# Patient Record
Sex: Male | Born: 1970 | Race: Black or African American | Hispanic: No | Marital: Married | Smoking: Never smoker
Health system: Southern US, Community
[De-identification: ages and names within clinical notes are randomized; demographics above are authoritative.]

---

## 2002-11-08 ENCOUNTER — Emergency Department (HOSPITAL_COMMUNITY): Admission: EM | Admit: 2002-11-08 | Discharge: 2002-11-08 | Payer: Self-pay | Admitting: Emergency Medicine

## 2006-04-21 ENCOUNTER — Ambulatory Visit (HOSPITAL_COMMUNITY): Admission: RE | Admit: 2006-04-21 | Discharge: 2006-04-21 | Payer: Self-pay | Admitting: Family Medicine

## 2011-03-21 ENCOUNTER — Inpatient Hospital Stay (INDEPENDENT_AMBULATORY_CARE_PROVIDER_SITE_OTHER)
Admission: RE | Admit: 2011-03-21 | Discharge: 2011-03-21 | Disposition: A | Discharge status: Home / Self Care | Payer: Commercial Managed Care - PPO | Source: Ambulatory Visit | Attending: Family Medicine | Admitting: Family Medicine

## 2011-03-21 DIAGNOSIS — K089 Disorder of teeth and supporting structures, unspecified: Secondary | ICD-10-CM

## 2015-05-31 ENCOUNTER — Emergency Department (HOSPITAL_BASED_OUTPATIENT_CLINIC_OR_DEPARTMENT_OTHER)
Admission: EM | Admit: 2015-05-31 | Discharge: 2015-05-31 | Disposition: A | Discharge status: Home / Self Care | Payer: 59 | Attending: Emergency Medicine | Admitting: Emergency Medicine

## 2015-05-31 ENCOUNTER — Encounter (HOSPITAL_BASED_OUTPATIENT_CLINIC_OR_DEPARTMENT_OTHER): Payer: Self-pay

## 2015-05-31 DIAGNOSIS — L989 Disorder of the skin and subcutaneous tissue, unspecified: Secondary | ICD-10-CM | POA: Diagnosis not present

## 2015-05-31 DIAGNOSIS — M79642 Pain in left hand: Secondary | ICD-10-CM | POA: Diagnosis present

## 2015-05-31 NOTE — Discharge Instructions (Signed)
You will need to follow up with a dermatologist. [facility] are usually harmless growths on the skin. They are accumulations of color (pigment) cells in the skin that:   Can be various colors, from light brown to black.  Can appear anywhere on the body.  May remain flat or become raised.  May contain hairs.  May remain smooth or develop wrinkling. Most moles are not cancerous (benign). However, some moles may develop changes and become cancerous. It is important to check your moles every month. If you check your moles regularly, you will be able to notice any changes that may occur.  CAUSES  Moles occur when skin cells grow together in clusters instead of spreading out in the skin as they normally do. The reason for this clustering is unknown. DIAGNOSIS  Your caregiver will perform a skin examination to diagnose your mole.  TREATMENT  Moles usually do not require treatment. If a mole becomes worrisome, your caregiver may choose to take a sample of the mole or remove it entirely, and then send it to a lab for examination.  HOME CARE INSTRUCTIONS  Check your mole(s) monthly for changes that may indicate skin cancer. These changes can include:  A change in size.  A change in color. Note that moles tend to darken during pregnancy or when taking birth control pills (oral contraception).  A change in shape.  A change in the border of the mole.  Wear sunscreen (with an SPF of at least 30) when you spend long periods of time outside. Reapply the sunscreen every 2-3 hours.  Schedule annual appointments with your skin doctor (dermatologist) if you have a large number of moles. SEEK MEDICAL CARE IF:  Your mole changes size, especially if it becomes larger than a pencil eraser.  Your mole changes in color or develops more than one color.  Your mole becomes itchy or bleeds.  Your mole, or the skin near the mole, becomes painful, sore, red, or swollen.  Your mole becomes scaly, sheds  skin, or oozes fluid.  Your mole develops irregular borders.  Your mole becomes flat or develops raised areas.  Your mole becomes hard or soft. Document Released: 06/09/2001 Document Revised: 06/08/2012 Document Reviewed: 03/28/2012 [facility] Patient Information 2015 [place], [place]. This information is not intended to replace advice given to you by your health care provider. Make sure you discuss any questions you have with your health care provider.

## 2015-05-31 NOTE — ED Notes (Signed)
C/o painful "spot" on left index finger x 1 mont-denies injury
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
## 2015-09-28 ENCOUNTER — Emergency Department (HOSPITAL_COMMUNITY)
Admission: EM | Admit: 2015-09-28 | Discharge: 2015-09-28 | Disposition: A | Discharge status: Home / Self Care | Payer: No Typology Code available for payment source | Attending: Emergency Medicine | Admitting: Emergency Medicine

## 2015-09-28 ENCOUNTER — Emergency Department (HOSPITAL_COMMUNITY): Payer: No Typology Code available for payment source

## 2015-09-28 ENCOUNTER — Encounter (HOSPITAL_COMMUNITY): Payer: Self-pay

## 2015-09-28 DIAGNOSIS — Z23 Encounter for immunization: Secondary | ICD-10-CM | POA: Diagnosis not present

## 2015-09-28 DIAGNOSIS — Y9389 Activity, other specified: Secondary | ICD-10-CM | POA: Insufficient documentation

## 2015-09-28 DIAGNOSIS — S3991XA Unspecified injury of abdomen, initial encounter: Secondary | ICD-10-CM | POA: Insufficient documentation

## 2015-09-28 DIAGNOSIS — S50811A Abrasion of right forearm, initial encounter: Secondary | ICD-10-CM | POA: Insufficient documentation

## 2015-09-28 DIAGNOSIS — S0993XA Unspecified injury of face, initial encounter: Secondary | ICD-10-CM | POA: Diagnosis present

## 2015-09-28 DIAGNOSIS — Y92410 Unspecified street and highway as the place of occurrence of the external cause: Secondary | ICD-10-CM | POA: Diagnosis not present

## 2015-09-28 DIAGNOSIS — S6991XA Unspecified injury of right wrist, hand and finger(s), initial encounter: Secondary | ICD-10-CM | POA: Diagnosis not present

## 2015-09-28 DIAGNOSIS — S299XXA Unspecified injury of thorax, initial encounter: Secondary | ICD-10-CM | POA: Diagnosis not present

## 2015-09-28 DIAGNOSIS — T148XXA Other injury of unspecified body region, initial encounter: Secondary | ICD-10-CM

## 2015-09-28 DIAGNOSIS — IMO0002 Reserved for concepts with insufficient information to code with codable children: Secondary | ICD-10-CM

## 2015-09-28 DIAGNOSIS — S61411A Laceration without foreign body of right hand, initial encounter: Secondary | ICD-10-CM | POA: Insufficient documentation

## 2015-09-28 DIAGNOSIS — Y999 Unspecified external cause status: Secondary | ICD-10-CM | POA: Diagnosis not present

## 2015-09-28 DIAGNOSIS — S0181XA Laceration without foreign body of other part of head, initial encounter: Secondary | ICD-10-CM | POA: Diagnosis not present

## 2015-09-28 LAB — I-STAT CHEM 8, ED
BUN: 12 mg/dL (ref 6–20)
CALCIUM ION: 1.17 mmol/L (ref 1.12–1.23)
CHLORIDE: 103 mmol/L (ref 101–111)
CREATININE: 1 mg/dL (ref 0.61–1.24)
Glucose, Bld: 97 mg/dL (ref 65–99)
HEMATOCRIT: 44 % (ref 39.0–52.0)
Hemoglobin: 15 g/dL (ref 13.0–17.0)
Potassium: 3.3 mmol/L — ABNORMAL LOW (ref 3.5–5.1)
SODIUM: 141 mmol/L (ref 135–145)
TCO2: 24 mmol/L (ref 0–100)

## 2015-09-28 LAB — CBC WITH DIFFERENTIAL/PLATELET
Basophils Absolute: 0 10*3/uL (ref 0.0–0.1)
Basophils Relative: 1 %
EOS PCT: 1 %
Eosinophils Absolute: 0.1 10*3/uL (ref 0.0–0.7)
HCT: 41 % (ref 39.0–52.0)
Hemoglobin: 14.3 g/dL (ref 13.0–17.0)
LYMPHS ABS: 2 10*3/uL (ref 0.7–4.0)
LYMPHS PCT: 32 %
MCH: 30.6 pg (ref 26.0–34.0)
MCHC: 34.9 g/dL (ref 30.0–36.0)
MCV: 87.8 fL (ref 78.0–100.0)
MONO ABS: 0.5 10*3/uL (ref 0.1–1.0)
MONOS PCT: 8 %
Neutro Abs: 3.8 10*3/uL (ref 1.7–7.7)
Neutrophils Relative %: 58 %
PLATELETS: 258 10*3/uL (ref 150–400)
RBC: 4.67 MIL/uL (ref 4.22–5.81)
RDW: 14 % (ref 11.5–15.5)
WBC: 6.4 10*3/uL (ref 4.0–10.5)

## 2015-09-28 LAB — COMPREHENSIVE METABOLIC PANEL
ALT: 29 U/L (ref 17–63)
AST: 25 U/L (ref 15–41)
Albumin: 3.6 g/dL (ref 3.5–5.0)
Alkaline Phosphatase: 71 U/L (ref 38–126)
Anion gap: 9 (ref 5–15)
BILIRUBIN TOTAL: 0.6 mg/dL (ref 0.3–1.2)
BUN: 11 mg/dL (ref 6–20)
CHLORIDE: 105 mmol/L (ref 101–111)
CO2: 24 mmol/L (ref 22–32)
Calcium: 9.1 mg/dL (ref 8.9–10.3)
Creatinine, Ser: 1.06 mg/dL (ref 0.61–1.24)
Glucose, Bld: 110 mg/dL — ABNORMAL HIGH (ref 65–99)
POTASSIUM: 3.5 mmol/L (ref 3.5–5.1)
Sodium: 138 mmol/L (ref 135–145)
TOTAL PROTEIN: 7.4 g/dL (ref 6.5–8.1)

## 2015-09-28 LAB — I-STAT TROPONIN, ED: TROPONIN I, POC: 0 ng/mL (ref 0.00–0.08)

## 2015-09-28 LAB — ETHANOL

## 2015-09-28 MED ORDER — HYDROCODONE-ACETAMINOPHEN 5-325 MG PO TABS: 2.0000 | ORAL_TABLET | ORAL | Status: AC | PRN

## 2015-09-28 MED ORDER — SODIUM CHLORIDE 0.9 % IV BOLUS (SEPSIS)
1000.0000 mL | Freq: Once | INTRAVENOUS | Status: AC
  Administered 2015-09-28: 1000 mL via INTRAVENOUS

## 2015-09-28 MED ORDER — CYCLOBENZAPRINE HCL 10 MG PO TABS: 10.0000 mg | ORAL_TABLET | Freq: Three times a day (TID) | ORAL | Status: AC | PRN

## 2015-09-28 MED ORDER — IOHEXOL 300 MG/ML  SOLN
100.0000 mL | Freq: Once | INTRAMUSCULAR | Status: AC | PRN
  Administered 2015-09-28: 100 mL via INTRAVENOUS

## 2015-09-28 MED ORDER — TETANUS-DIPHTH-ACELL PERTUSSIS 5-2.5-18.5 LF-MCG/0.5 IM SUSP
0.5000 mL | Freq: Once | INTRAMUSCULAR | Status: AC
  Administered 2015-09-28: 0.5 mL via INTRAMUSCULAR
  Filled 2015-09-28 (×2): qty 0.5

## 2015-09-28 MED ORDER — LIDOCAINE-EPINEPHRINE (PF) 2 %-1:200000 IJ SOLN
10.0000 mL | Freq: Once | INTRAMUSCULAR | Status: AC
  Administered 2015-09-28: 10 mL
  Filled 2015-09-28: qty 10

## 2015-09-28 MED ORDER — IBUPROFEN 800 MG PO TABS: 800.0000 mg | ORAL_TABLET | Freq: Three times a day (TID) | ORAL | Status: DC
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
## 2020-11-14 ENCOUNTER — Other Ambulatory Visit: Payer: Self-pay

## 2020-11-14 ENCOUNTER — Ambulatory Visit: Payer: 59 | Admitting: Podiatry

## 2020-11-14 ENCOUNTER — Ambulatory Visit (INDEPENDENT_AMBULATORY_CARE_PROVIDER_SITE_OTHER): Payer: 59

## 2020-11-14 DIAGNOSIS — M722 Plantar fascial fibromatosis: Secondary | ICD-10-CM

## 2020-11-14 DIAGNOSIS — M79672 Pain in left foot: Secondary | ICD-10-CM | POA: Diagnosis not present

## 2020-11-14 DIAGNOSIS — M79671 Pain in right foot: Secondary | ICD-10-CM | POA: Diagnosis not present

## 2020-11-14 MED ORDER — TRIAMCINOLONE ACETONIDE 10 MG/ML IJ SUSP
10.0000 mg | Freq: Once | INTRAMUSCULAR | Status: AC
  Administered 2020-11-14: 10 mg

## 2020-11-14 MED ORDER — MELOXICAM 15 MG PO TABS: 15.0000 mg | ORAL_TABLET | Freq: Every day | ORAL | 0 refills | Status: DC

## 2020-11-14 NOTE — Patient Instructions (Signed)
If was nice to meet you today. If you have any questions or any further concerns, please feel fee to give me a call. You can call our office at [phone] or please feel fee to send me a message through MyChart.   ----  For instructions on how to put on your Plantar Fascial Brace, please visit [email or web address]  ---     Plantar Fasciitis (Heel Spur Syndrome) with Rehab The plantar fascia is a fibrous, ligament-like, soft-tissue structure that spans the bottom of the foot. Plantar fasciitis is a condition that causes pain in the foot due to inflammation of the tissue. SYMPTOMS  Pain and tenderness on the underneath side of the foot. Pain that worsens with standing or walking. CAUSES  Plantar fasciitis is caused by irritation and injury to the plantar fascia on the underneath side of the foot. Common mechanisms of injury include: Direct trauma to bottom of the foot. Damage to a small nerve that runs under the foot where the main fascia attaches to the heel bone. Stress placed on the plantar fascia due to bone spurs. RISK INCREASES WITH:  Activities that place stress on the plantar fascia (running, jumping, pivoting, or cutting). Poor strength and flexibility. Improperly fitted shoes. Tight calf muscles. Flat feet. Failure to warm-up properly before activity. Obesity. PREVENTION Warm up and stretch properly before activity. Allow for adequate recovery between workouts. Maintain physical fitness: Strength, flexibility, and endurance. Cardiovascular fitness. Maintain a health body weight. Avoid stress on the plantar fascia. Wear properly fitted shoes, including arch supports for individuals who have flat feet.  PROGNOSIS  If treated properly, then the symptoms of plantar fasciitis usually resolve without surgery. However, occasionally surgery is necessary.  RELATED COMPLICATIONS  Recurrent symptoms that may result in a chronic condition. Problems of the lower back  that are caused by compensating for the injury, such as limping. Pain or weakness of the foot during push-off following surgery. Chronic inflammation, scarring, and partial or complete fascia tear, occurring more often from repeated injections.  TREATMENT  Treatment initially involves the use of ice and medication to help reduce pain and inflammation. The use of strengthening and stretching exercises may help reduce pain with activity, especially stretches of the Achilles tendon. These exercises may be performed at home or with a therapist. Your caregiver may recommend that you use heel cups of arch supports to help reduce stress on the plantar fascia. Occasionally, corticosteroid injections are given to reduce inflammation. If symptoms persist for greater than 6 months despite non-surgical (conservative), then surgery may be recommended.   MEDICATION  If pain medication is necessary, then nonsteroidal anti-inflammatory medications, such as aspirin and ibuprofen, or other minor pain relievers, such as acetaminophen, are often recommended. Do not take pain medication within 7 days before surgery. Prescription pain relievers may be given if deemed necessary by your caregiver. Use only as directed and only as much as you need. Corticosteroid injections may be given by your caregiver. These injections should be reserved for the most serious cases, because they may only be given a certain number of times.  HEAT AND COLD Cold treatment (icing) relieves pain and reduces inflammation. Cold treatment should be applied for 10 to 15 minutes every 2 to 3 hours for inflammation and pain and immediately after any activity that aggravates your symptoms. Use ice packs or massage the area with a piece of ice (ice massage). Heat treatment may be used prior to performing the stretching and strengthening activities prescribed by your caregiver,   physical therapist, or athletic trainer. Use a heat pack or soak the injury  in warm water.  SEEK IMMEDIATE MEDICAL CARE IF: Treatment seems to offer no benefit, or the condition worsens. Any medications produce adverse side effects.  EXERCISES- RANGE OF MOTION (ROM) AND STRETCHING EXERCISES - Plantar Fasciitis (Heel Spur Syndrome) These exercises may help you when beginning to rehabilitate your injury. Your symptoms may resolve with or without further involvement from your physician, physical therapist or athletic trainer. While completing these exercises, remember:  Restoring tissue flexibility helps normal motion to return to the joints. This allows healthier, less painful movement and activity. An effective stretch should be held for at least 30 seconds. A stretch should never be painful. You should only feel a gentle lengthening or release in the stretched tissue.  RANGE OF MOTION - Toe Extension, Flexion Sit with your right / left leg crossed over your opposite knee. Grasp your toes and gently pull them back toward the top of your foot. You should feel a stretch on the bottom of your toes and/or foot. Hold this stretch for 10 seconds. Now, gently pull your toes toward the bottom of your foot. You should feel a stretch on the top of your toes and or foot. Hold this stretch for 10 seconds. Repeat  times. Complete this stretch 3 times per day.   RANGE OF MOTION - Ankle Dorsiflexion, Active Assisted Remove shoes and sit on a chair that is preferably not on a carpeted surface. Place right / left foot under knee. Extend your opposite leg for support. Keeping your heel down, slide your right / left foot back toward the chair until you feel a stretch at your ankle or calf. If you do not feel a stretch, slide your bottom forward to the edge of the chair, while still keeping your heel down. Hold this stretch for 10 seconds. Repeat 3 times. Complete this stretch 2 times per day.   STRETCH  Gastroc, Standing Place hands on wall. Extend right / left leg, keeping the  front knee somewhat bent. Slightly point your toes inward on your back foot. Keeping your right / left heel on the floor and your knee straight, shift your weight toward the wall, not allowing your back to arch. You should feel a gentle stretch in the right / left calf. Hold this position for 10 seconds. Repeat 3 times. Complete this stretch 2 times per day.  STRETCH  Soleus, Standing Place hands on wall. Extend right / left leg, keeping the other knee somewhat bent. Slightly point your toes inward on your back foot. Keep your right / left heel on the floor, bend your back knee, and slightly shift your weight over the back leg so that you feel a gentle stretch deep in your back calf. Hold this position for 10 seconds. Repeat 3 times. Complete this stretch 2 times per day.  STRETCH  Gastrocsoleus, Standing  Note: This exercise can place a lot of stress on your foot and ankle. Please complete this exercise only if specifically instructed by your caregiver.  Place the ball of your right / left foot on a step, keeping your other foot firmly on the same step. Hold on to the wall or a rail for balance. Slowly lift your other foot, allowing your body weight to press your heel down over the edge of the step. You should feel a stretch in your right / left calf. Hold this position for 10 seconds. Repeat this exercise with a   slight bend in your right / left knee. Repeat 3 times. Complete this stretch 2 times per day.   STRENGTHENING EXERCISES - Plantar Fasciitis (Heel Spur Syndrome)  These exercises may help you when beginning to rehabilitate your injury. They may resolve your symptoms with or without further involvement from your physician, physical therapist or athletic trainer. While completing these exercises, remember:  Muscles can gain both the endurance and the strength needed for everyday activities through controlled exercises. Complete these exercises as instructed by your physician,  physical therapist or athletic trainer. Progress the resistance and repetitions only as guided.  STRENGTH - Towel Curls Sit in a chair positioned on a non-carpeted surface. Place your foot on a towel, keeping your heel on the floor. Pull the towel toward your heel by only curling your toes. Keep your heel on the floor. Repeat 3 times. Complete this exercise 2 times per day.  STRENGTH - Ankle Inversion Secure one end of a rubber exercise band/tubing to a fixed object (table, pole). Loop the other end around your foot just before your toes. Place your fists between your knees. This will focus your strengthening at your ankle. Slowly, pull your big toe up and in, making sure the band/tubing is positioned to resist the entire motion. Hold this position for 10 seconds. Have your muscles resist the band/tubing as it slowly pulls your foot back to the starting position. Repeat 3 times. Complete this exercises 2 times per day.  Document Released: 09/14/2005 Document Revised: 12/07/2011 Document Reviewed: 12/27/2008 [employer] Patient Information 2014 [employer].  

## 2020-11-14 NOTE — Progress Notes (Signed)
Subjective:   Patient ID: Thomas Jensen, male   DOB: 50 y.o.   MRN: [id number]   HPI 50 year old male presents the office today for concerns of bilateral heel pain with the right side worse than left.  This is been ongoing for few years but seems to be getting worse for last couple of weeks.  He states that the pain is intermittent and depends on the day.  Some days ago it started to hurt when he first starts work or sometimes after being on his feet all day.  Denies any recent injury or trauma.  No swelling.  No radiating pain or weakness.  Has had no recent treatment.  He has tried several shoes otherwise not found any comfortable shoes.  Currently wearing [employer]'s at work.  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  No past medical history on file.  No past surgical history on file.   Current Outpatient Medications:  .  meloxicam (MOBIC) 15 MG tablet, Take 1 tablet (15 mg total) by mouth daily., Disp: 30 tablet, Rfl: 0 .  cyclobenzaprine (FLEXERIL) 10 MG tablet, Take 1 tablet (10 mg total) by mouth 3 (three) times daily as needed for muscle spasms., Disp: 20 tablet, Rfl: 0 .  HYDROcodone-acetaminophen (NORCO/VICODIN) 5-325 MG tablet, Take 2 tablets by mouth every 4 (four) hours as needed for moderate pain., Disp: 20 tablet, Rfl: 0 .  ibuprofen (ADVIL,MOTRIN) 800 MG tablet, Take 1 tablet (800 mg total) by mouth 3 (three) times daily., Disp: 21 tablet, Rfl: 0  No Known Allergies       Objective:  Physical Exam  General: AAO x3, NAD  Dermatological: Skin is warm, dry and supple bilateral.  There are no open sores, no preulcerative lesions, no rash or signs of infection present.  Vascular: Dorsalis Pedis artery and Posterior Tibial artery pedal pulses are 2/4 bilateral with immedate capillary fill time.  There is no pain with calf compression, swelling, warmth, erythema.   Neruologic: Grossly intact via  light touch bilateral.  Negative L-spine.  Musculoskeletal: Tenderness palpation on plantar medial tubercle of the calcaneus insertion of plantar fashion the right side today.  No significant left side today.  There is no pain with lateral compression of calcaneus.  No area of pinpoint tenderness.  No edema, erythema.  Muscular strength 5/5 in all groups tested bilateral. Mild pronation   Gait: Unassisted, Nonantalgic.      Assessment:   Bilateral heel pain, plantar fasciitis    Plan:  -Treatment options discussed including all alternatives, risks, and complications -Etiology of symptoms were discussed -X-rays were obtained and reviewed with the patient.  There is no evidence of acute fracture or stress fracture.  Small posterior calcaneal spurring present left side. -Steroid injection performed the right side.  See procedure note below. -Bilateral plantar fascial brace dispensed. -Discussed stretching, icing daily. -Discussed shoe modifications and orthotics.  Recommend him to go to [employer] for shoes. Coupon provided -Prescribed mobic. Discussed side effects of the medication and directed to stop if any are to occur and call the office.   Procedure: Injection Tendon/Ligament Discussed alternatives, risks, complications and verbal consent was obtained.  Location: RIGHT plantar fascia at the glabrous junction; medial approach. Skin Prep: Alcohol  Injectate: 0.5cc 0.5% marcaine plain, 0.5 cc 2% lidocaine plain and, 1 cc kenalog 10. Disposition: Patient tolerated procedure well. Injection site dressed with a band-aid.  Post-injection  care was discussed and return precautions discussed.   Return in about 4 weeks (around 12/12/2020), or if symptoms worsen or fail to improve.  Vivi Barrack DPM

## 2020-12-12 ENCOUNTER — Other Ambulatory Visit: Payer: Self-pay

## 2020-12-12 MED ORDER — MELOXICAM 15 MG PO TABS: 15.0000 mg | ORAL_TABLET | Freq: Every day | ORAL | 0 refills | Status: AC

## 2023-10-29 ENCOUNTER — Ambulatory Visit (INDEPENDENT_AMBULATORY_CARE_PROVIDER_SITE_OTHER): Payer: 59 | Admitting: Podiatry

## 2023-10-29 DIAGNOSIS — M722 Plantar fascial fibromatosis: Secondary | ICD-10-CM

## 2023-10-29 MED ORDER — MELOXICAM 15 MG PO TABS: 15.0000 mg | ORAL_TABLET | Freq: Every day | ORAL | 0 refills | Status: DC | PRN

## 2023-10-29 NOTE — Progress Notes (Unsigned)
Subjective: Chief Complaint  Patient presents with   Foot Pain    RM#12 Patient states is in need of bilateral injections for heel pain. Has been wearing braces and purchased new shoes since last visit got some relief from last injection as well.   53 year old male presents the office with above concerns.  States the injections were helpful previously.  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  Does not report any recent injuries.  She does not report any numbness or tingling.  No new concerns.  Objective: AAO x3, NAD DP/PT pulses palpable bilaterally, CRT less than 3 seconds Tenderness to palpation along the plantar medial tubercle of the calcaneus at the insertion of plantar fascia on the left and right foot.  They are about the same in each foot.  There is no pain along the course of the plantar fascia within the arch of the foot. Plantar fascia appears to be intact. There is no pain with lateral compression of the calcaneus or pain with vibratory sensation. There is no pain along the course or insertion of the achilles tendon. No other areas of tenderness to bilateral lower extremities.  Negative Tinel sign. No pain with calf compression, swelling, warmth, erythema  Assessment: Recurrent plantar fasciitis  Plan: -All treatment options discussed with the patient including all alternatives, risks, complications.  -Steroid injections full bilaterally.  See procedure note below. -Dispensed plantar fascia braces to help support and stabilize the plantar fascia to help decrease pain -Stretching, icing daily -Discussed shoes, good arch support -Patient encouraged to call the office with any questions, concerns, change in symptoms.   Procedure: Injection Tendon/Ligament Discussed alternatives, risks, complications and verbal consent was obtained.  Location: Bilateral plantar fascia at the glabrous junction; medial approach. Skin  Prep: Alcohol. Injectate: 0.5cc 0.5% marcaine plain, 0.5 cc 2% lidocaine plain and, 1 cc kenalog 10. Disposition: Patient tolerated procedure well. Injection site dressed with a band-aid.  Post-injection care was discussed and return precautions discussed.   Return if symptoms worsen or fail to improve.  Vivi Barrack DPM

## 2023-10-29 NOTE — Patient Instructions (Addendum)
For inserts I like POWERSTEPS, SUPERFEET, AETREX  ----  While at your visit today you received a steroid injection in your foot or ankle to help with your pain. Along with having the steroid medication there is some "numbing" medication in the shot that you received. Due to this you may notice some numbness to the area for the next couple of hours.   I would recommend limiting activity for the next few days to help the steroid injection take affect.    The actually benefit from the steroid injection may take up to 2-7 days to see a difference. You may actually experience a small (as in 10%) INCREASE in pain in the first 24 hours---that is common. It would be best if you can ice the area today and take anti-inflammatory medications (such as Ibuprofen, Motrin, or Aleve) if you are able to take these medications. If you were prescribed another medication to help with the pain go ahead and start that medication today    Things to watch out for that you should contact us or a health care provider urgently would include: 1. Unusual (as in more than 10%) increase in pain 2. New fever > 101.5 3. New swelling or redness of the injected area.  4. Streaking of red lines around the area injected.  If you have any questions or concerns about this, please give our office a call at [phone].    --  Plantar Fasciitis (Heel Spur Syndrome) with Rehab The plantar fascia is a fibrous, ligament-like, soft-tissue structure that spans the bottom of the foot. Plantar fasciitis is a condition that causes pain in the foot due to inflammation of the tissue. SYMPTOMS  Pain and tenderness on the underneath side of the foot. Pain that worsens with standing or walking. CAUSES  Plantar fasciitis is caused by irritation and injury to the plantar fascia on the underneath side of the foot. Common mechanisms of injury include: Direct trauma to bottom of the foot. Damage to a small nerve that runs under the foot where  the main fascia attaches to the heel bone. Stress placed on the plantar fascia due to bone spurs. RISK INCREASES WITH:  Activities that place stress on the plantar fascia (running, jumping, pivoting, or cutting). Poor strength and flexibility. Improperly fitted shoes. Tight calf muscles. Flat feet. Failure to warm-up properly before activity. Obesity. PREVENTION Warm up and stretch properly before activity. Allow for adequate recovery between workouts. Maintain physical fitness: Strength, flexibility, and endurance. Cardiovascular fitness. Maintain a health body weight. Avoid stress on the plantar fascia. Wear properly fitted shoes, including arch supports for individuals who have flat feet.  PROGNOSIS  If treated properly, then the symptoms of plantar fasciitis usually resolve without surgery. However, occasionally surgery is necessary.  RELATED COMPLICATIONS  Recurrent symptoms that may result in a chronic condition. Problems of the lower back that are caused by compensating for the injury, such as limping. Pain or weakness of the foot during push-off following surgery. Chronic inflammation, scarring, and partial or complete fascia tear, occurring more often from repeated injections.  TREATMENT  Treatment initially involves the use of ice and medication to help reduce pain and inflammation. The use of strengthening and stretching exercises may help reduce pain with activity, especially stretches of the Achilles tendon. These exercises may be performed at home or with a therapist. Your caregiver may recommend that you use heel cups of arch supports to help reduce stress on the plantar fascia. Occasionally, corticosteroid injections are given  to reduce inflammation. If symptoms persist for greater than 6 months despite non-surgical (conservative), then surgery may be recommended.   MEDICATION  If pain medication is necessary, then nonsteroidal anti-inflammatory medications, such as  aspirin and ibuprofen, or other minor pain relievers, such as acetaminophen, are often recommended. Do not take pain medication within 7 days before surgery. Prescription pain relievers may be given if deemed necessary by your caregiver. Use only as directed and only as much as you need. Corticosteroid injections may be given by your caregiver. These injections should be reserved for the most serious cases, because they may only be given a certain number of times.  HEAT AND COLD Cold treatment (icing) relieves pain and reduces inflammation. Cold treatment should be applied for 10 to 15 minutes every 2 to 3 hours for inflammation and pain and immediately after any activity that aggravates your symptoms. Use ice packs or massage the area with a piece of ice (ice massage). Heat treatment may be used prior to performing the stretching and strengthening activities prescribed by your caregiver, physical therapist, or athletic trainer. Use a heat pack or soak the injury in warm water.  SEEK IMMEDIATE MEDICAL CARE IF: Treatment seems to offer no benefit, or the condition worsens. Any medications produce adverse side effects.  EXERCISES- RANGE OF MOTION (ROM) AND STRETCHING EXERCISES - Plantar Fasciitis (Heel Spur Syndrome) These exercises may help you when beginning to rehabilitate your injury. Your symptoms may resolve with or without further involvement from your physician, physical therapist or athletic trainer. While completing these exercises, remember:  Restoring tissue flexibility helps normal motion to return to the joints. This allows healthier, less painful movement and activity. An effective stretch should be held for at least 30 seconds. A stretch should never be painful. You should only feel a gentle lengthening or release in the stretched tissue.  RANGE OF MOTION - Toe Extension, Flexion Sit with your right / left leg crossed over your opposite knee. Grasp your toes and gently pull them  back toward the top of your foot. You should feel a stretch on the bottom of your toes and/or foot. Hold this stretch for 10 seconds. Now, gently pull your toes toward the bottom of your foot. You should feel a stretch on the top of your toes and or foot. Hold this stretch for 10 seconds. Repeat  times. Complete this stretch 3 times per day.   RANGE OF MOTION - Ankle Dorsiflexion, Active Assisted Remove shoes and sit on a chair that is preferably not on a carpeted surface. Place right / left foot under knee. Extend your opposite leg for support. Keeping your heel down, slide your right / left foot back toward the chair until you feel a stretch at your ankle or calf. If you do not feel a stretch, slide your bottom forward to the edge of the chair, while still keeping your heel down. Hold this stretch for 10 seconds. Repeat 3 times. Complete this stretch 2 times per day.   STRETCH  Gastroc, Standing Place hands on wall. Extend right / left leg, keeping the front knee somewhat bent. Slightly point your toes inward on your back foot. Keeping your right / left heel on the floor and your knee straight, shift your weight toward the wall, not allowing your back to arch. You should feel a gentle stretch in the right / left calf. Hold this position for 10 seconds. Repeat 3 times. Complete this stretch 2 times per day.  STRETCH  Soleus, Standing Place hands on wall. Extend right / left leg, keeping the other knee somewhat bent. Slightly point your toes inward on your back foot. Keep your right / left heel on the floor, bend your back knee, and slightly shift your weight over the back leg so that you feel a gentle stretch deep in your back calf. Hold this position for 10 seconds. Repeat 3 times. Complete this stretch 2 times per day.  STRETCH  Gastrocsoleus, Standing  Note: This exercise can place a lot of stress on your foot and ankle. Please complete this exercise only if specifically instructed  by your caregiver.  Place the ball of your right / left foot on a step, keeping your other foot firmly on the same step. Hold on to the wall or a rail for balance. Slowly lift your other foot, allowing your body weight to press your heel down over the edge of the step. You should feel a stretch in your right / left calf. Hold this position for 10 seconds. Repeat this exercise with a slight bend in your right / left knee. Repeat 3 times. Complete this stretch 2 times per day.   STRENGTHENING EXERCISES - Plantar Fasciitis (Heel Spur Syndrome)  These exercises may help you when beginning to rehabilitate your injury. They may resolve your symptoms with or without further involvement from your physician, physical therapist or athletic trainer. While completing these exercises, remember:  Muscles can gain both the endurance and the strength needed for everyday activities through controlled exercises. Complete these exercises as instructed by your physician, physical therapist or athletic trainer. Progress the resistance and repetitions only as guided.  STRENGTH - Towel Curls Sit in a chair positioned on a non-carpeted surface. Place your foot on a towel, keeping your heel on the floor. Pull the towel toward your heel by only curling your toes. Keep your heel on the floor. Repeat 3 times. Complete this exercise 2 times per day.  STRENGTH - Ankle Inversion Secure one end of a rubber exercise band/tubing to a fixed object (table, pole). Loop the other end around your foot just before your toes. Place your fists between your knees. This will focus your strengthening at your ankle. Slowly, pull your big toe up and in, making sure the band/tubing is positioned to resist the entire motion. Hold this position for 10 seconds. Have your muscles resist the band/tubing as it slowly pulls your foot back to the starting position. Repeat 3 times. Complete this exercises 2 times per day.  Document Released:  09/14/2005 Document Revised: 12/07/2011 Document Reviewed: 12/27/2008 [facility] Patient Information 2014 [place], [place].

## 2023-11-09 ENCOUNTER — Other Ambulatory Visit: Payer: Self-pay | Admitting: Podiatry

## 2023-11-20 ENCOUNTER — Other Ambulatory Visit: Payer: Self-pay | Admitting: Podiatry

## 2024-03-10 ENCOUNTER — Telehealth: Payer: Self-pay | Admitting: Podiatry

## 2024-03-10 ENCOUNTER — Other Ambulatory Visit: Payer: Self-pay | Admitting: Podiatry

## 2024-03-10 MED ORDER — MELOXICAM 15 MG PO TABS: 15.0000 mg | ORAL_TABLET | Freq: Every day | ORAL | 0 refills | Status: DC | PRN

## 2024-03-10 NOTE — Telephone Encounter (Signed)
 Patient is requesting refill of meloxicam  (MOBIC ) 15 MG tablet

## 2024-04-04 ENCOUNTER — Other Ambulatory Visit: Payer: Self-pay | Admitting: Podiatry

## 2024-04-05 ENCOUNTER — Ambulatory Visit: Admitting: Podiatry

## 2024-04-05 ENCOUNTER — Encounter: Payer: Self-pay | Admitting: Podiatry

## 2024-04-05 DIAGNOSIS — M722 Plantar fascial fibromatosis: Secondary | ICD-10-CM | POA: Diagnosis not present

## 2024-04-05 NOTE — Patient Instructions (Signed)

## 2024-04-05 NOTE — Progress Notes (Signed)
  Subjective:  Patient ID: Thomas Jensen, male    DOB: November 20, 1970,   MRN: [id number]  Chief Complaint  Patient presents with   Plantar Fasciitis    I think I need injections again.  I think I may need more insoles in my shoes that I wear to work.    53 y.o. male presents for concern of bilateral plantar fasciitis. He has been following with Dr. Gershon. Relates injections have helped in the past and was wondering what else can be done. Also relates he may need more insoles in his shoes.  Denies any other pedal complaints. Denies n/v/f/c.   No past medical history on file.  Objective:  Physical Exam: Vascular: DP/PT pulses 2/4 bilateral. CFT <3 seconds. Normal hair growth on digits. No edema.  Skin. No lacerations or abrasions bilateral feet.  Musculoskeletal: MMT 5/5 bilateral lower extremities in DF, PF, Inversion and Eversion. Deceased ROM in DF of ankle joint. Tender to the medial calcaneal tubercle bilaterally . No pain with achilles, PT or arch. No pain with calcaneal squeeze.  Neurological: Sensation intact to light touch.   Assessment:   1. Plantar fasciitis      Plan:  Patient was evaluated and treated and all questions answered. Discussed plantar fasciitis with patient.  X-rays reviewed and discussed with patient. No acute fractures or dislocations noted. Mild spurring noted at inferior calcaneus.  Discussed treatment options including, ice, NSAIDS, supportive shoes, bracing, and stretching. Continue stretching and anti-inflammatories.  Stretching exercises provided.  Patient deferred injecgtion today.  Follow-up as needed     Asberry Failing, DPM

## 2024-04-15 ENCOUNTER — Other Ambulatory Visit: Payer: Self-pay | Admitting: Podiatry

## 2024-05-04 ENCOUNTER — Encounter: Payer: Self-pay | Admitting: Podiatry

## 2024-05-04 ENCOUNTER — Ambulatory Visit: Admitting: Podiatry

## 2024-05-04 VITALS — Ht 69.0 in | Wt 246.0 lb

## 2024-05-04 DIAGNOSIS — M722 Plantar fascial fibromatosis: Secondary | ICD-10-CM

## 2024-05-04 NOTE — Patient Instructions (Signed)
For instructions on how to put on your Night Splint, please visit [email or web address]  ---   While at your visit today you received a steroid injection in your foot or ankle to help with your pain. Along with having the steroid medication there is some "numbing" medication in the shot that you received. Due to this you may notice some numbness to the area for the next couple of hours.   I would recommend limiting activity for the next few days to help the steroid injection take affect.    The actually benefit from the steroid injection may take up to 2-7 days to see a difference. You may actually experience a small (as in 10%) INCREASE in pain in the first 24 hours---that is common. It would be best if you can ice the area today and take anti-inflammatory medications (such as Ibuprofen, Motrin, or Aleve) if you are able to take these medications. If you were prescribed another medication to help with the pain go ahead and start that medication today    Things to watch out for that you should contact us or a health care provider urgently would include: 1. Unusual (as in more than 10%) increase in pain 2. New fever > 101.5 3. New swelling or redness of the injected area.  4. Streaking of red lines around the area injected.  If you have any questions or concerns about this, please give our office a call at [phone].   ---  Plantar Fasciitis (Heel Spur Syndrome) with Rehab The plantar fascia is a fibrous, ligament-like, soft-tissue structure that spans the bottom of the foot. Plantar fasciitis is a condition that causes pain in the foot due to inflammation of the tissue. SYMPTOMS  Pain and tenderness on the underneath side of the foot. Pain that worsens with standing or walking. CAUSES  Plantar fasciitis is caused by irritation and injury to the plantar fascia on the underneath side of the foot. Common mechanisms of injury include: Direct trauma to bottom of the foot. Damage to a  small nerve that runs under the foot where the main fascia attaches to the heel bone. Stress placed on the plantar fascia due to bone spurs. RISK INCREASES WITH:  Activities that place stress on the plantar fascia (running, jumping, pivoting, or cutting). Poor strength and flexibility. Improperly fitted shoes. Tight calf muscles. Flat feet. Failure to warm-up properly before activity. Obesity. PREVENTION Warm up and stretch properly before activity. Allow for adequate recovery between workouts. Maintain physical fitness: Strength, flexibility, and endurance. Cardiovascular fitness. Maintain a health body weight. Avoid stress on the plantar fascia. Wear properly fitted shoes, including arch supports for individuals who have flat feet.  PROGNOSIS  If treated properly, then the symptoms of plantar fasciitis usually resolve without surgery. However, occasionally surgery is necessary.  RELATED COMPLICATIONS  Recurrent symptoms that may result in a chronic condition. Problems of the lower back that are caused by compensating for the injury, such as limping. Pain or weakness of the foot during push-off following surgery. Chronic inflammation, scarring, and partial or complete fascia tear, occurring more often from repeated injections.  TREATMENT  Treatment initially involves the use of ice and medication to help reduce pain and inflammation. The use of strengthening and stretching exercises may help reduce pain with activity, especially stretches of the Achilles tendon. These exercises may be performed at home or with a therapist. Your caregiver may recommend that you use heel cups of arch supports to help reduce stress on the plantar   fascia. Occasionally, corticosteroid injections are given to reduce inflammation. If symptoms persist for greater than 6 months despite non-surgical (conservative), then surgery may be recommended.   MEDICATION  If pain medication is necessary, then  nonsteroidal anti-inflammatory medications, such as aspirin and ibuprofen, or other minor pain relievers, such as acetaminophen, are often recommended. Do not take pain medication within 7 days before surgery. Prescription pain relievers may be given if deemed necessary by your caregiver. Use only as directed and only as much as you need. Corticosteroid injections may be given by your caregiver. These injections should be reserved for the most serious cases, because they may only be given a certain number of times.  HEAT AND COLD Cold treatment (icing) relieves pain and reduces inflammation. Cold treatment should be applied for 10 to 15 minutes every 2 to 3 hours for inflammation and pain and immediately after any activity that aggravates your symptoms. Use ice packs or massage the area with a piece of ice (ice massage). Heat treatment may be used Jer[place] to performing the stretching and strengthening activities prescribed by your caregiver, physical therapist, or athletic trainer. Use a heat pack or soak the injury in warm water.  SEEK IMMEDIATE MEDICAL CARE IF: Treatment seems to offer no benefit, or the condition worsens. Any medications produce adverse side effects.  EXERCISES- RANGE OF MOTION (ROM) AND STRETCHING EXERCISES - Plantar Fasciitis (Heel Spur Syndrome) These exercises may help you when beginning to rehabilitate your injury. Your symptoms may resolve with or without further involvement from your physician, physical therapist or athletic trainer. While completing these exercises, remember:  Restoring tissue flexibility helps normal motion to return to the joints. This allows healthier, less painful movement and activity. An effective stretch should be held for at least 30 seconds. A stretch should never be painful. You should only feel a gentle lengthening or release in the stretched tissue.  RANGE OF MOTION - Toe Extension, Flexion Sit with your right / left leg crossed over your  opposite knee. Grasp your toes and gently pull them back toward the top of your foot. You should feel a stretch on the bottom of your toes and/or foot. Hold this stretch for 10 seconds. Now, gently pull your toes toward the bottom of your foot. You should feel a stretch on the top of your toes and or foot. Hold this stretch for 10 seconds. Repeat  times. Complete this stretch 3 times per day.   RANGE OF MOTION - Ankle Dorsiflexion, Active Assisted Remove shoes and sit on a chair that is preferably not on a carpeted surface. Place right / left foot under knee. Extend your opposite leg for support. Keeping your heel down, slide your right / left foot back toward the chair until you feel a stretch at your ankle or calf. If you do not feel a stretch, slide your bottom forward to the edge of the chair, while still keeping your heel down. Hold this stretch for 10 seconds. Repeat 3 times. Complete this stretch 2 times per day.   STRETCH  Gastroc, Standing Place hands on wall. Extend right / left leg, keeping the front knee somewhat bent. Slightly point your toes inward on your back foot. Keeping your right / left heel on the floor and your knee straight, shift your weight toward the wall, not allowing your back to arch. You should feel a gentle stretch in the right / left calf. Hold this position for 10 seconds. Repeat 3 times. Complete this stretch 2   times per day.  STRETCH  Soleus, Standing Place hands on wall. Extend right / left leg, keeping the other knee somewhat bent. Slightly point your toes inward on your back foot. Keep your right / left heel on the floor, bend your back knee, and slightly shift your weight over the back leg so that you feel a gentle stretch deep in your back calf. Hold this position for 10 seconds. Repeat 3 times. Complete this stretch 2 times per day.  STRETCH  Gastrocsoleus, Standing  Note: This exercise can place a lot of stress on your foot and ankle. Please  complete this exercise only if specifically instructed by your caregiver.  Place the ball of your right / left foot on a step, keeping your other foot firmly on the same step. Hold on to the wall or a rail for balance. Slowly lift your other foot, allowing your body weight to press your heel down over the edge of the step. You should feel a stretch in your right / left calf. Hold this position for 10 seconds. Repeat this exercise with a slight bend in your right / left knee. Repeat 3 times. Complete this stretch 2 times per day.   STRENGTHENING EXERCISES - Plantar Fasciitis (Heel Spur Syndrome)  These exercises may help you when beginning to rehabilitate your injury. They may resolve your symptoms with or without further involvement from your physician, physical therapist or athletic trainer. While completing these exercises, remember:  Muscles can gain both the endurance and the strength needed for everyday activities through controlled exercises. Complete these exercises as instructed by your physician, physical therapist or athletic trainer. Progress the resistance and repetitions only as guided.  STRENGTH - Towel Curls Sit in a chair positioned on a non-carpeted surface. Place your foot on a towel, keeping your heel on the floor. Pull the towel toward your heel by only curling your toes. Keep your heel on the floor. Repeat 3 times. Complete this exercise 2 times per day.  STRENGTH - Ankle Inversion Secure one end of a rubber exercise band/tubing to a fixed object (table, pole). Loop the other end around your foot just before your toes. Place your fists between your knees. This will focus your strengthening at your ankle. Slowly, pull your big toe up and in, making sure the band/tubing is positioned to resist the entire motion. Hold this position for 10 seconds. Have your muscles resist the band/tubing as it slowly pulls your foot back to the starting position. Repeat 3 times. Complete  this exercises 2 times per day.  Document Released: 09/14/2005 Document Revised: 12/07/2011 Document Reviewed: 12/27/2008 [employer] Patient Information 2014 [employer].  

## 2024-05-04 NOTE — Progress Notes (Signed)
 Subjective: Chief Complaint  Patient presents with   Injections    Pt states he is here to receive bilateral injection into heels due to pain.    53 year old male presents the office with above concerns.  States the last injections were helpful.  Originally he was having pain with certain times but now the pain is becoming more consistent when he is working as well.  He does not report any recent injuries or changes.  He did get the custom inserts but seem to make his feet worse for the first couple of days.  No recent injury or changes otherwise.  No radiating pain.  No other concerns.   Objective: AAO x3, NAD DP/PT pulses palpable bilaterally, CRT less than 3 seconds Tenderness to palpation along the plantar medial tubercle of the calcaneus at the insertion of the plantar fascia.  Plantar fascia appears to be intact.  No pain to the arch of the foot.  No pain to the Achilles tendon.  Equinus is present.  There is no pain with lateral compression of calcaneus there is no area pinpoint tenderness. No pain with calf compression, swelling, warmth, erythema  Assessment: Recurrent plantar fasciitis  Plan: -All treatment options discussed with the patient including all alternatives, risks, complications.  -Steroid injections performed bilaterally.  See procedure note below.  Discussed repeat injection risk and okay to hold off on further injections for some time. - Continue plantar fascia brace - Dispensed night splint. Static or dynamic ankle foot orthosis, including soft interface material, adjustable for fit, for positioning, may be used for minimal ambulation, prefabricated, off-the-shelf was dispensed on the billed date of service  - I am going to have him follow-up with Sueanne, pedorthist for modifications of his inserts. -Stretching, icing daily -Discussed shoes, good arch support -Patient encouraged to call the office with any questions, concerns, change in symptoms.   Procedure:  Injection Tendon/Ligament Discussed alternatives, risks, complications and verbal consent was obtained.  Location: Bilateral plantar fascia at the glabrous junction; medial approach. Skin Prep: Alcohol. Injectate: 0.5cc 0.5% marcaine plain, 0.5 cc 2% lidocaine  plain and, 1 cc kenalog  10. Disposition: Patient tolerated procedure well. Injection site dressed with a band-aid.  Post-injection care was discussed and return precautions discussed.   Return if symptoms worsen or fail to improve.  Thomas Jensen DPM

## 2024-05-18 ENCOUNTER — Ambulatory Visit

## 2024-05-18 NOTE — Progress Notes (Signed)
 Patient has powersteps that have been bothering him  I heated them up and had him walk on them to mold better to his foot shape patient was happy with it and states they feel much better  Patient will FU if any other problems arise  If so can thin out insert to make more flexible  Lolita Schultze Cped, CFo, CFm
